# Patient Record
Sex: Male | Born: 1985 | Race: Black or African American | Hispanic: No | Marital: Single | Smoking: Never smoker
Health system: Southern US, Community
[De-identification: ages and names within clinical notes are randomized; demographics above are authoritative.]

## PROBLEM LIST (undated history)

## (undated) HISTORY — PX: MOUTH SURGERY: SHX715

---
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
## 2009-06-18 ENCOUNTER — Emergency Department (HOSPITAL_COMMUNITY): Admission: EM | Admit: 2009-06-18 | Discharge: 2009-06-19 | Payer: Self-pay | Admitting: Emergency Medicine

## 2011-07-05 ENCOUNTER — Emergency Department (HOSPITAL_COMMUNITY)
Admission: EM | Admit: 2011-07-05 | Discharge: 2011-07-05 | Disposition: A | Discharge status: Home / Self Care | Payer: Self-pay | Attending: Emergency Medicine | Admitting: Emergency Medicine

## 2011-07-05 ENCOUNTER — Encounter (HOSPITAL_COMMUNITY): Payer: Self-pay | Admitting: Emergency Medicine

## 2011-07-05 DIAGNOSIS — F17200 Nicotine dependence, unspecified, uncomplicated: Secondary | ICD-10-CM | POA: Insufficient documentation

## 2011-07-05 DIAGNOSIS — H1130 Conjunctival hemorrhage, unspecified eye: Secondary | ICD-10-CM | POA: Insufficient documentation

## 2011-07-05 DIAGNOSIS — H1132 Conjunctival hemorrhage, left eye: Secondary | ICD-10-CM

## 2011-07-05 MED ORDER — ERYTHROMYCIN 5 MG/GM OP OINT: TOPICAL_OINTMENT | OPHTHALMIC | Status: AC

## 2011-07-05 MED ORDER — ERYTHROMYCIN 5 MG/GM OP OINT
TOPICAL_OINTMENT | Freq: Four times a day (QID) | OPHTHALMIC | Status: DC
  Administered 2011-07-05: 1 via OPHTHALMIC
  Filled 2011-07-05: qty 3.5

## 2011-07-05 NOTE — ED Notes (Signed)
Pt presented to the ER with c/o left eye pain, states started after vomiting, on Sunday, and painful since then , pt pr multiple things are tried, ice pack and warm compress to the area. Pt denies any injury to the left eye.

## 2011-07-05 NOTE — ED Notes (Signed)
Patient discharge via ambulatory with a steady gait with friend. Respirations equal and unlabored. Skin warm and dry. No acute distress noted.

## 2011-07-05 NOTE — Discharge Instructions (Signed)
Use eye ointment as prescribed. Try to not rub the eyes as this will irritate it further. Cool compresses 3-4 times a day will help soothe the eye. You can try over-the-counter eye lubricating drops as well. Followup with the eye doctor as indicated on Monday for recheck. Return to the emergency room for worsening condition or new concerning symptoms.

## 2011-07-05 NOTE — ED Provider Notes (Signed)
History     CSN: [id number]  Arrival date & time 07/05/11  0019   First MD Initiated Contact with Patient 07/05/11 0107      Chief Complaint  Patient presents with  . Eye Pain    (Consider location/radiation/quality/duration/timing/severity/associated sxs/prior treatment) HPI 26 year old male presents emergency department with complaint of left eye irritation. Patient reports he had onset of redness to his eye 5 days ago after prolonged vomiting on Sunday night. Patient has had irritation of the eye and has been rubbing it ever since. He reports some crusting to the eye in the mornings when waking. He is tried both warm and cool compresses without improvement. He denies any change in his vision. No prior history of similar symptoms. No trauma to the eye. No foreign object sensation to the eye History reviewed. No pertinent past medical history.  Past Surgical History  Procedure Date  . Mouth surgery     History reviewed. No pertinent family history.  History  Substance Use Topics  . Smoking status: Current Everyday Smoker    Types: Cigarettes  . Smokeless tobacco: Not on file  . Alcohol Use: Yes      Review of Systems  All other systems reviewed and are negative.    Allergies  Review of patient's allergies indicates no known allergies.  Home Medications   Current Outpatient Rx  Name Route Sig Dispense Refill  . ERYTHROMYCIN 5 MG/GM OP OINT  Place a 1/2 inch ribbon of ointment into the lower eyelid of left eye qid x 5 days 1 g 0    BP 120/77  Pulse 82  Temp(Src) 98.5 F (36.9 C) (Oral)  Resp 20  Wt 175 lb 2 oz (79.436 kg)  SpO2 99%  Physical Exam  Constitutional: He appears well-developed and well-nourished. No distress.  HENT:  Head: Normocephalic.  Nose: Nose normal.  Mouth/Throat: Oropharynx is clear and moist.  Eyes: EOM are normal. Pupils are equal, round, and reactive to light. Right eye exhibits no discharge. Left eye exhibits no discharge. No  scleral icterus.       Left conjunctiva noted to have some conjunctival hemorrhage both at the nasal aspect and at the temporal aspect. Some mild chemosis and injection to the conjunctiva diffusely  Skin: He is not diaphoretic.    ED Course  Procedures (including critical care time)  Labs Reviewed - No data to display No results found.   1. Subconjunctival hemorrhage, left       MDM  26 year old male with left sub-conjunctival hemorrhage most likely due to his recent episode of vomiting. Suspect due to patient's rubbing of his eye he has caused secondary irritation. He may have some aspect of mild conjunctivitis. Will start on erythromycin ointment to help soothe the eye. Patient to use cold compresses and avoid rubbing the eye. He is to follow up with ophthalmology on Monday if not improved        Olivia Mackie, MD 07/05/11 [phone]

## 2013-09-20 DIAGNOSIS — S40019A Contusion of unspecified shoulder, initial encounter: Secondary | ICD-10-CM | POA: Insufficient documentation

## 2013-09-20 DIAGNOSIS — Y92410 Unspecified street and highway as the place of occurrence of the external cause: Secondary | ICD-10-CM | POA: Insufficient documentation

## 2013-09-20 DIAGNOSIS — S20219A Contusion of unspecified front wall of thorax, initial encounter: Secondary | ICD-10-CM | POA: Insufficient documentation

## 2013-09-20 DIAGNOSIS — S8000XA Contusion of unspecified knee, initial encounter: Secondary | ICD-10-CM | POA: Insufficient documentation

## 2013-09-20 DIAGNOSIS — Y9389 Activity, other specified: Secondary | ICD-10-CM | POA: Insufficient documentation

## 2013-09-20 DIAGNOSIS — F17200 Nicotine dependence, unspecified, uncomplicated: Secondary | ICD-10-CM | POA: Insufficient documentation

## 2013-09-21 ENCOUNTER — Emergency Department (HOSPITAL_COMMUNITY): Payer: No Typology Code available for payment source | Attending: Emergency Medicine

## 2013-09-21 ENCOUNTER — Emergency Department (HOSPITAL_COMMUNITY): Payer: No Typology Code available for payment source

## 2013-09-21 ENCOUNTER — Emergency Department (HOSPITAL_COMMUNITY)
Admission: EM | Admit: 2013-09-21 | Discharge: 2013-09-21 | Disposition: A | Discharge status: Home / Self Care | Payer: Self-pay | Attending: Emergency Medicine | Admitting: Emergency Medicine

## 2013-09-21 ENCOUNTER — Encounter (HOSPITAL_COMMUNITY): Payer: Self-pay | Admitting: Emergency Medicine

## 2013-09-21 DIAGNOSIS — S40022A Contusion of left upper arm, initial encounter: Secondary | ICD-10-CM

## 2013-09-21 DIAGNOSIS — S20212A Contusion of left front wall of thorax, initial encounter: Secondary | ICD-10-CM

## 2013-09-21 DIAGNOSIS — S40012A Contusion of left shoulder, initial encounter: Secondary | ICD-10-CM

## 2013-09-21 DIAGNOSIS — S8002XA Contusion of left knee, initial encounter: Secondary | ICD-10-CM

## 2013-09-21 MED ORDER — IBUPROFEN 600 MG PO TABS: 600.0000 mg | ORAL_TABLET | Freq: Four times a day (QID) | ORAL | Status: AC | PRN

## 2013-09-21 MED ORDER — TRAMADOL HCL 50 MG PO TABS: 50.0000 mg | ORAL_TABLET | Freq: Four times a day (QID) | ORAL | Status: AC | PRN
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
## 2013-09-21 NOTE — Discharge Instructions (Signed)

## 2015-10-31 ENCOUNTER — Encounter (HOSPITAL_COMMUNITY): Payer: Self-pay | Admitting: Emergency Medicine

## 2015-10-31 ENCOUNTER — Ambulatory Visit (HOSPITAL_COMMUNITY)
Admission: EM | Admit: 2015-10-31 | Discharge: 2015-10-31 | Disposition: A | Discharge status: Home / Self Care | Payer: Self-pay | Attending: Family Medicine | Admitting: Family Medicine

## 2015-10-31 DIAGNOSIS — R112 Nausea with vomiting, unspecified: Secondary | ICD-10-CM

## 2015-10-31 DIAGNOSIS — R197 Diarrhea, unspecified: Secondary | ICD-10-CM

## 2015-10-31 MED ORDER — ONDANSETRON HCL 4 MG PO TABS: 4.0000 mg | ORAL_TABLET | Freq: Four times a day (QID) | ORAL | 0 refills | Status: AC

## 2015-10-31 MED ORDER — ONDANSETRON 4 MG PO TBDP
ORAL_TABLET | ORAL | Status: AC
  Filled 2015-10-31: qty 2

## 2015-10-31 MED ORDER — ONDANSETRON 4 MG PO TBDP
8.0000 mg | ORAL_TABLET | Freq: Once | ORAL | Status: AC
  Administered 2015-10-31: 8 mg via ORAL

## 2015-10-31 NOTE — ED Triage Notes (Signed)
Pt. Stated, I've been nausea with vomiting since last night. I took some Pepto bismol bu tit did not help

## 2015-10-31 NOTE — ED Provider Notes (Signed)
[facility]    CSN: [id number] Arrival date & time: 10/31/15  [id number]  First Provider Contact:  First MD Initiated Contact with Patient 10/31/15 2011        History   Chief Complaint Chief Complaint  Patient presents with  . Nausea  . Emesis    HPI Neil Travis is a 30 y.o. male.   The history is provided by the patient.  Emesis  Severity:  Mild Duration:  1 day Quality:  Stomach contents Progression:  Improving (last episode this am ,also with diarrhea.) Chronicity:  New Relieved by:  Nothing Worsened by:  Nothing Ineffective treatments:  None tried Associated symptoms: abdominal pain and diarrhea   Associated symptoms: no fever   Risk factors: suspect food intake   Risk factors: no sick contacts     History reviewed. No pertinent past medical history.  There are no active problems to display for this patient.   Past Surgical History:  Procedure Laterality Date  . MOUTH SURGERY         Home Medications    Prior to Admission medications   Medication Sig Start Date End Date Taking? Authorizing Provider  ibuprofen (ADVIL,MOTRIN) 600 MG tablet Take 1 tablet (600 mg total) by mouth every 6 (six) hours as needed. 09/21/13   Brandt LoosenJulie Manly, MD  traMADol (ULTRAM) 50 MG tablet Take 1 tablet (50 mg total) by mouth every 6 (six) hours as needed. 09/21/13   Brandt LoosenJulie Manly, MD    Family History No family history on file.  Social History Social History  Substance Use Topics  . Smoking status: Current Every Day Smoker    Types: Cigarettes  . Smokeless tobacco: Current User  . Alcohol use Yes     Allergies   Review of patient's allergies indicates no known allergies.   Review of Systems Review of Systems  Constitutional: Positive for appetite change. Negative for fever.  HENT: Negative.   Respiratory: Negative.   Cardiovascular: Negative.   Gastrointestinal: Positive for abdominal pain, diarrhea, nausea and vomiting. Negative for blood in stool.    Genitourinary: Negative.   All other systems reviewed and are negative.    Physical Exam Triage Vital Signs ED Triage Vitals  Enc Vitals Group     BP 10/31/15 1948 142/93     Pulse Rate 10/31/15 1948 71     Resp 10/31/15 1948 17     Temp 10/31/15 1948 98.6 F (37 C)     Temp Source 10/31/15 1948 Oral     SpO2 10/31/15 1948 99 %     Weight 10/31/15 1949 200 lb (90.7 kg)     Height 10/31/15 1949 5\' 10"  (1.778 m)     Head Circumference --      Peak Flow --      Pain Score 10/31/15 1950 0     Pain Loc --      Pain Edu? --      Excl. in GC? --    No data found.   Updated Vital Signs BP 142/93 (BP Location: Right Arm)   Pulse 71   Temp 98.6 F (37 C) (Oral)   Resp 17   Ht 5\' 10"  (1.778 m)   Wt 200 lb (90.7 kg)   SpO2 99%   BMI 28.70 kg/m   Visual Acuity Right Eye Distance:   Left Eye Distance:   Bilateral Distance:    Right Eye Near:   Left Eye Near:    Bilateral Near:  Physical Exam  Constitutional: He is oriented to person, place, and time. He appears well-developed and well-nourished.  HENT:  Mouth/Throat: Oropharynx is clear and moist.  Neck: Normal range of motion. Neck supple.  Abdominal: Soft. Bowel sounds are normal. He exhibits no distension and no mass. There is no tenderness. There is no rebound and no guarding. No hernia.  Musculoskeletal: Normal range of motion.  Lymphadenopathy:    He has no cervical adenopathy.  Neurological: He is alert and oriented to person, place, and time.  Skin: Skin is warm and dry.  Nursing note and vitals reviewed.    UC Treatments / Results  Labs (all labs ordered are listed, but only abnormal results are displayed) Labs Reviewed - No data to display  EKG  EKG Interpretation None       Radiology No results found.  Procedures Procedures (including critical care time)  Medications Ordered in UC Medications - No data to display   Initial Impression / Assessment and Plan / UC Course  I have  reviewed the triage vital signs and the nursing notes.  Pertinent labs & imaging results that were available during my care of the patient were reviewed by me and considered in my medical decision making (see chart for details).  Clinical Course      Final Clinical Impressions(s) / UC Diagnoses   Final diagnoses:  None    New Prescriptions New Prescriptions   No medications on file     Linna Hoff, MD 10/31/15 2032

## 2015-10-31 NOTE — Discharge Instructions (Signed)
Clear liquid , bland diet tonight as tolerated, advance on wed as improved, use medicine as needed,imodium for diarrhea. return or see your doctor if any problems.
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
## 2018-02-26 ENCOUNTER — Encounter (HOSPITAL_COMMUNITY): Payer: Self-pay

## 2018-02-26 ENCOUNTER — Emergency Department (HOSPITAL_COMMUNITY)
Admission: EM | Admit: 2018-02-26 | Discharge: 2018-02-26 | Disposition: A | Discharge status: Home / Self Care | Payer: Self-pay | Attending: Emergency Medicine | Admitting: Emergency Medicine

## 2018-02-26 DIAGNOSIS — J020 Streptococcal pharyngitis: Secondary | ICD-10-CM | POA: Insufficient documentation

## 2018-02-26 DIAGNOSIS — F17210 Nicotine dependence, cigarettes, uncomplicated: Secondary | ICD-10-CM | POA: Insufficient documentation

## 2018-02-26 LAB — GROUP A STREP BY PCR: Group A Strep by PCR: DETECTED — AB

## 2018-02-26 MED ORDER — PENICILLIN V POTASSIUM 500 MG PO TABS: 500.0000 mg | ORAL_TABLET | Freq: Two times a day (BID) | ORAL | 0 refills | Status: AC

## 2018-02-26 MED ORDER — DEXAMETHASONE SODIUM PHOSPHATE 10 MG/ML IJ SOLN
10.0000 mg | Freq: Once | INTRAMUSCULAR | Status: AC
  Administered 2018-02-26: 10 mg via INTRAMUSCULAR
  Filled 2018-02-26: qty 1

## 2018-02-26 NOTE — Discharge Instructions (Addendum)
You were evaluated today for sore throat. You strep test was positive. I have prescribed you Penicillin. Please take as prescribed. You may take Tylenol and Ibuprofen for your symptoms.  Return to the ED with any new or worsening symptoms.

## 2018-02-26 NOTE — ED Provider Notes (Signed)
[facility] EMERGENCY DEPARTMENT Provider Note   CSN: [id number] Arrival date & time: 02/26/18  1429  History   Chief Complaint Sore throat   HPI Neil Travis is a 32 y.o. male with past medical history significant for seasonal allergies who presents for evaluation of sore throat.  Patient states he started with a scratchy throat onset 3 weeks ago, however worsened over the last 2 days.  Patient states he was previously on Zyrtec for seasonal allergies which he states he has been out for the last 2 weeks.  Not take anything for symptoms PTA.  Patient states he does have itchy watery eyes as well as clear rhinorrhea.  Has been able to tolerate p.o. intake without difficulty.  Denies fever, chills, neck pain, neck stiffness, cough, chest pain, shortness of breath, abdominal pain, diarrhea.  Pain rates an 8/10.  Pain does not radiate.  Denies aggravating or alleviating factors. Patient states he has had and increase in sneezing as well.  History obtained from patient.  No interpreter was used.  HPI  History reviewed. No pertinent past medical history.  There are no active problems to display for this patient.   Past Surgical History:  Procedure Laterality Date  . MOUTH SURGERY         Home Medications    Prior to Admission medications   Medication Sig Start Date End Date Taking? Authorizing Provider  ibuprofen (ADVIL,MOTRIN) 600 MG tablet Take 1 tablet (600 mg total) by mouth every 6 (six) hours as needed. 09/21/13   Brandt Loosen, MD  ondansetron (ZOFRAN) 4 MG tablet Take 1 tablet (4 mg total) by mouth every 6 (six) hours. Prn n/v. 10/31/15   Linna Hoff, MD  penicillin v potassium (VEETID) 500 MG tablet Take 1 tablet (500 mg total) by mouth 2 (two) times daily with a meal for 10 days. 02/26/18 03/08/18  Henderly, Britni A, PA-C  traMADol (ULTRAM) 50 MG tablet Take 1 tablet (50 mg total) by mouth every 6 (six) hours as needed. 09/21/13   Brandt Loosen, MD     Family History History reviewed. No pertinent family history.  Social History Social History   Tobacco Use  . Smoking status: Current Every Day Smoker    Types: Cigarettes  . Smokeless tobacco: Current User  Substance Use Topics  . Alcohol use: Yes  . Drug use: No     Allergies   Patient has no known allergies.   Review of Systems Review of Systems  Constitutional: Negative.   HENT: Positive for postnasal drip, rhinorrhea, sneezing and sore throat. Negative for congestion, drooling, ear discharge, ear pain, facial swelling, hearing loss, mouth sores, nosebleeds, sinus pressure, sinus pain, tinnitus, trouble swallowing and voice change.   Eyes: Positive for itching. Negative for photophobia, pain, redness and visual disturbance.       Clear watery bilateral eye discharge  Respiratory: Negative.   Cardiovascular: Negative.   Gastrointestinal: Negative.   Genitourinary: Negative.   Musculoskeletal: Negative.   Skin: Negative.   Neurological: Negative.   All other systems reviewed and are negative.    Physical Exam Updated Vital Signs BP (!) 149/99 (BP Location: Right Arm)   Pulse 95   Temp 98.3 F (36.8 C) (Oral)   Resp 18   Ht 5\' 10"  (1.778 m)   Wt 95.3 kg   SpO2 100%   BMI 30.13 kg/m   Physical Exam Vitals signs and nursing note reviewed.  Constitutional:      General:  He is not in acute distress.    Appearance: He is well-developed. He is not ill-appearing, toxic-appearing or diaphoretic.  HENT:     Head: Normocephalic and atraumatic.     Right Ear: Tympanic membrane, ear canal and external ear normal. No drainage, swelling or tenderness. There is no impacted cerumen. Tympanic membrane is not scarred, perforated, erythematous or retracted.     Left Ear: Tympanic membrane, ear canal and external ear normal. No drainage, swelling or tenderness. There is no impacted cerumen. Tympanic membrane is not scarred, perforated, erythematous or retracted.      Nose: Rhinorrhea present.     Right Turbinates: Enlarged and swollen. Not pale.     Left Turbinates: Enlarged and swollen. Not pale.     Right Sinus: No maxillary sinus tenderness or frontal sinus tenderness.     Left Sinus: No maxillary sinus tenderness or frontal sinus tenderness.     Mouth/Throat:     Lips: Pink.     Mouth: Mucous membranes are moist. No injury, oral lesions or angioedema.     Tongue: No lesions.     Pharynx: Oropharynx is clear. Uvula midline. No oropharyngeal exudate, posterior oropharyngeal erythema or uvula swelling.     Tonsils: No tonsillar exudate or tonsillar abscesses. Swelling: 1+ on the right. 1+ on the left.     Comments: Posterior oropharynx with erythema without edema or exudate.  Uvula midline without edema.  Tonsils 1+ bilaterally, no gross exudate noted.  Phonation normal.  No evidence of PTA, RPA, deep space infection. Eyes:     Pupils: Pupils are equal, round, and reactive to light.  Neck:     Musculoskeletal: Full passive range of motion without pain, normal range of motion and neck supple. Normal range of motion. No edema, erythema, neck rigidity, crepitus, injury, pain with movement, torticollis, spinous process tenderness or muscular tenderness.     Thyroid: No thyroid mass, thyromegaly or thyroid tenderness.     Vascular: No carotid bruit or JVD.     Trachea: Trachea and phonation normal. No tracheal tenderness.     Comments: Nontender to palpation.  Full range of motion without difficulty.  No stiffness or rigidity.  No cervical lymphadenopathy. Cardiovascular:     Rate and Rhythm: Normal rate and regular rhythm.     Pulses: Normal pulses.     Heart sounds: Normal heart sounds. No friction rub. No gallop.   Pulmonary:     Effort: Pulmonary effort is normal. No respiratory distress.  Abdominal:     General: There is no distension.     Palpations: Abdomen is soft.  Musculoskeletal: Normal range of motion.     Right lower leg: No edema.      Left lower leg: No edema.  Lymphadenopathy:     Cervical: No cervical adenopathy.  Skin:    General: Skin is warm and dry.  Neurological:     Mental Status: He is alert.      ED Treatments / Results  Labs (all labs ordered are listed, but only abnormal results are displayed) Labs Reviewed  GROUP A STREP BY PCR - Abnormal; Notable for the following components:      Result Value   Group A Strep by PCR DETECTED (*)    All other components within normal limits    EKG None  Radiology No results found.  Procedures Procedures (including critical care time)  Medications Ordered in ED Medications  dexamethasone (DECADRON) injection 10 mg (10 mg Intramuscular Given 02/26/18 1554)  Initial Impression / Assessment and Plan / ED Course  I have reviewed the triage vital signs and the nursing notes.  Pertinent labs & imaging results that were available during my care of the patient were reviewed by me and considered in my medical decision making (see chart for details).  32 year old male who appears otherwise well presents for evaluation of sore throat.  Pain onset 3 weeks ago, however worsened over the last 2 days.  Able to tolerate p.o. intake without difficulty.  Afebrile, nonseptic, non-ill-appearing.  Patient with mild posterior oropharyngeal erythema without edema.  Tonsils 1+ without any gross exudate.  Uvula midline without edema.  No evidence of PTA, RPA or deep space infection.  No drooling or dysphasia.  Patient able to tolerate p.o. intake in department without difficulty.  No neck stiffness or neck rigidity.  Received steroids in department.  Patient with moist mucous membranes.  No suspicion for dehydration, however discussed with patient importance of proper hydration.  No trismus.  Patient is hemodynamically stable and appropriate for DC home at this time.  Patient did have mild elevation in his blood pressure.  He is asymptomatic without any chest pain, shortness of  breath, headache or vision changes.  Discussed follow-up with PCP for reevaluation of his blood pressure.  Discussed strict return precautions.  Patient voiced understanding and is agreeable for follow-up.    Final Clinical Impressions(s) / ED Diagnoses   Final diagnoses:  Strep pharyngitis    ED Discharge Orders         Ordered    penicillin v potassium (VEETID) 500 MG tablet  2 times daily with meals     02/26/18 1659           Henderly, Britni A, PA-C 02/26/18 1707    Melene PlanFloyd, Dan, DO 02/26/18 1836

## 2018-02-26 NOTE — ED Triage Notes (Signed)
Pt. Reports symtoms started 3 weeks ago. Pt. Reports feeling that it was an allergy from work that has turned into a sore throat. Pt. A/o X4

## 2019-10-29 ENCOUNTER — Other Ambulatory Visit: Payer: Self-pay

## 2019-10-29 ENCOUNTER — Emergency Department (HOSPITAL_COMMUNITY): Admission: EM | Admit: 2019-10-29 | Discharge: 2019-10-30 | Discharge status: Against Medical Advice | Payer: Self-pay

## 2019-10-29 NOTE — ED Notes (Signed)
No answer in WR x1

## 2019-10-30 ENCOUNTER — Encounter (HOSPITAL_COMMUNITY): Payer: Self-pay | Admitting: *Deleted

## 2019-10-30 ENCOUNTER — Other Ambulatory Visit: Payer: Self-pay

## 2019-10-30 ENCOUNTER — Ambulatory Visit (HOSPITAL_COMMUNITY)
Admission: EM | Admit: 2019-10-30 | Discharge: 2019-10-30 | Disposition: A | Discharge status: Home / Self Care | Payer: HRSA Program | Attending: Urgent Care | Admitting: Urgent Care

## 2019-10-30 DIAGNOSIS — Z880 Allergy status to penicillin: Secondary | ICD-10-CM | POA: Insufficient documentation

## 2019-10-30 DIAGNOSIS — Z79899 Other long term (current) drug therapy: Secondary | ICD-10-CM | POA: Insufficient documentation

## 2019-10-30 DIAGNOSIS — Z791 Long term (current) use of non-steroidal anti-inflammatories (NSAID): Secondary | ICD-10-CM | POA: Insufficient documentation

## 2019-10-30 DIAGNOSIS — U071 COVID-19: Secondary | ICD-10-CM | POA: Insufficient documentation

## 2019-10-30 DIAGNOSIS — J069 Acute upper respiratory infection, unspecified: Secondary | ICD-10-CM

## 2019-10-30 MED ORDER — CEPACOL SORE THROAT 5.4 MG MT LOZG
1.0000 | LOZENGE | OROMUCOSAL | 0 refills | Status: AC | PRN
Start: 2019-10-30 — End: ?

## 2019-10-30 MED ORDER — BENZONATATE 100 MG PO CAPS: 100.0000 mg | ORAL_CAPSULE | Freq: Three times a day (TID) | ORAL | 0 refills | Status: AC

## 2019-10-30 MED ORDER — FLUTICASONE PROPIONATE 50 MCG/ACT NA SUSP: 1.0000 | Freq: Every day | NASAL | 2 refills | Status: AC

## 2019-10-30 MED ORDER — ACETAMINOPHEN 325 MG PO TABS: 650.0000 mg | ORAL_TABLET | Freq: Four times a day (QID) | ORAL | 0 refills | Status: AC | PRN

## 2019-10-30 MED ORDER — ALBUTEROL SULFATE HFA 108 (90 BASE) MCG/ACT IN AERS: 1.0000 | INHALATION_SPRAY | Freq: Four times a day (QID) | RESPIRATORY_TRACT | 0 refills | Status: AC | PRN

## 2019-10-30 NOTE — Discharge Instructions (Signed)
Take medications as prescribed -Only use the inhaler as needed for chest tightness and cough up to every 6 hours  If you have developing worsening shortness of breath, high fevers or other severe symptoms go to the emergency department  If your Covid-19 test is positive, you will receive a phone call from [facility] regarding your results. Negative test results are not called. Both positive and negative results area always visible on MyChart. If you do not have a MyChart account, sign up instructions are in your discharge papers.   Persons who are directed to care for themselves at home may discontinue isolation under the following conditions:   At least 10 days have passed since symptom onset and  At least 24 hours have passed without running a fever (this means without the use of fever-reducing medications) and  Other symptoms have improved.  Persons infected with COVID-19 who never develop symptoms may discontinue isolation and other precautions 10 days after the date of their first positive COVID-19 test. .

## 2019-10-30 NOTE — ED Triage Notes (Signed)
C/O starting with sinus pressure 6 days ago; yesterday lost sense of taste and smell.  C/O fevers and chills.

## 2019-10-30 NOTE — ED Provider Notes (Signed)
[facility]    CSN: [id number] Arrival date & time: 10/30/19  1019      History   Chief Complaint Chief Complaint  Patient presents with   Loss of Taste/Smell   Chills    HPI Neil Travis is a 34 y.o. male.   Patient presents for nasal congestion, chills and loss of taste and smell.  Reports symptoms started around 6 days ago sinus pressure and congestion.  He has had body ache and chills over this course.  Noticed loss of taste and smell yesterday.  He reports a slight cough worse at night.  Nonproductive.  He also had a scratchy throat.  He has not had a fever at home.  There is been no nausea, vomiting or diarrhea.  Does endorse a low level headache.  He does endorse some chest tightness with cough.  Otherwise denies shortness of breath.  No chest pain.     History reviewed. No pertinent past medical history.  There are no problems to display for this patient.   Past Surgical History:  Procedure Laterality Date   MOUTH SURGERY         Home Medications    Prior to Admission medications   Medication Sig Start Date End Date Taking? Authorizing Provider  acetaminophen (TYLENOL) 325 MG tablet Take 2 tablets (650 mg total) by mouth every 6 (six) hours as needed. 10/30/19   Aliyah Abeyta, Veryl Speak, PA-C  albuterol (VENTOLIN HFA) 108 (90 Base) MCG/ACT inhaler Inhale 1-2 puffs into the lungs every 6 (six) hours as needed for wheezing or shortness of breath. 10/30/19   Evelyn Aguinaldo, Veryl Speak, PA-C  benzonatate (TESSALON) 100 MG capsule Take 1-2 capsules (100-200 mg total) by mouth 3 (three) times daily for 10 days. 10/30/19 11/09/19  Luisfernando Brightwell, Veryl Speak, PA-C  fluticasone (FLONASE) 50 MCG/ACT nasal spray Place 1 spray into both nostrils daily. 10/30/19   Leaira Fullam, Veryl Speak, PA-C  ibuprofen (ADVIL,MOTRIN) 600 MG tablet Take 1 tablet (600 mg total) by mouth every 6 (six) hours as needed. 09/21/13   Brandt Loosen, MD  Menthol (CEPACOL SORE THROAT) 5.4 MG LOZG Use as directed 1 lozenge (5.4 mg total)  in the mouth or throat every 2 (two) hours as needed. 10/30/19   Anet Logsdon, Veryl Speak, PA-C  ondansetron (ZOFRAN) 4 MG tablet Take 1 tablet (4 mg total) by mouth every 6 (six) hours. Prn n/v. 10/31/15   Linna Hoff, MD  traMADol (ULTRAM) 50 MG tablet Take 1 tablet (50 mg total) by mouth every 6 (six) hours as needed. 09/21/13   Brandt Loosen, MD    Family History Family History  Problem Relation Age of Onset   Healthy Mother    Healthy Father     Social History Social History   Tobacco Use   Smoking status: Never Smoker   Smokeless tobacco: Never Used  Building services engineer Use: Never used  Substance Use Topics   Alcohol use: Yes    Comment: rarely   Drug use: Yes    Types: Marijuana     Allergies   Apple and Penicillins   Review of Systems Review of Systems   Physical Exam Triage Vital Signs ED Triage Vitals  Enc Vitals Group     BP 10/30/19 1204 121/81     Pulse Rate 10/30/19 1202 84     Resp 10/30/19 1202 18     Temp 10/30/19 1202 100 F (37.8 C)     Temp Source 10/30/19 1202 Oral  SpO2 10/30/19 1202 98 %     Weight --      Height --      Head Circumference --      Peak Flow --      Pain Score 10/30/19 1205 1     Pain Loc --      Pain Edu? --      Excl. in GC? --    No data found.  Updated Vital Signs BP 121/81    Pulse 84    Temp 100 F (37.8 C) (Oral)    Resp 18    SpO2 98%   Visual Acuity Right Eye Distance:   Left Eye Distance:   Bilateral Distance:    Right Eye Near:   Left Eye Near:    Bilateral Near:     Physical Exam Vitals and nursing note reviewed.  Constitutional:      General: He is not in acute distress.    Appearance: He is well-developed. He is not ill-appearing or toxic-appearing.  HENT:     Head: Normocephalic and atraumatic.     Right Ear: Tympanic membrane normal.     Left Ear: Tympanic membrane normal.     Nose: Congestion and rhinorrhea present.     Mouth/Throat:     Comments: Postnasal drip Eyes:      Extraocular Movements: Extraocular movements intact.     Conjunctiva/sclera: Conjunctivae normal.     Pupils: Pupils are equal, round, and reactive to light.  Cardiovascular:     Rate and Rhythm: Normal rate and regular rhythm.     Heart sounds: No murmur heard.   Pulmonary:     Effort: Pulmonary effort is normal. No respiratory distress.     Breath sounds: Normal breath sounds. No wheezing, rhonchi or rales.  Abdominal:     Palpations: Abdomen is soft.     Tenderness: There is no abdominal tenderness.  Musculoskeletal:     Cervical back: Neck supple.  Skin:    General: Skin is warm and dry.  Neurological:     Mental Status: He is alert.      UC Treatments / Results  Labs (all labs ordered are listed, but only abnormal results are displayed) Labs Reviewed  SARS CORONAVIRUS 2 (TAT 6-24 HRS)    EKG   Radiology No results found.  Procedures Procedures (including critical care time)  Medications Ordered in UC Medications - No data to display  Initial Impression / Assessment and Plan / UC Course  I have reviewed the triage vital signs and the nursing notes.  Pertinent labs & imaging results that were available during my care of the patient were reviewed by me and considered in my medical decision making (see chart for details).    #Viral URI with cough Patient is 34 year old presenting with viral upper respiratory infection.  Low-grade temperature here.  Suspicious for Covid.  Covid sent.  Will treat symptomatically to include as needed albuterol inhaler for chest tightness.  Discussed return, follow-up and emergency department precautions.  Patient verbalized understand plan of care. Final Clinical Impressions(s) / UC Diagnoses   Final diagnoses:  Viral URI with cough     Discharge Instructions     Take medications as prescribed -Only use the inhaler as needed for chest tightness and cough up to every 6 hours  If you have developing worsening shortness of  breath, high fevers or other severe symptoms go to the emergency department  If your Covid-19 test is positive, you will receive  a phone call from [facility] regarding your results. Negative test results are not called. Both positive and negative results area always visible on MyChart. If you do not have a MyChart account, sign up instructions are in your discharge papers.   Persons who are directed to care for themselves at home may discontinue isolation under the following conditions:   At least 10 days have passed since symptom onset and  At least 24 hours have passed without running a fever (this means without the use of fever-reducing medications) and  Other symptoms have improved.  Persons infected with COVID-19 who never develop symptoms may discontinue isolation and other precautions 10 days after the date of their first positive COVID-19 test. .      ED Prescriptions    Medication Sig Dispense Auth. Provider   benzonatate (TESSALON) 100 MG capsule Take 1-2 capsules (100-200 mg total) by mouth 3 (three) times daily for 10 days. 60 capsule Daquana Paddock, Veryl Speak, PA-C   fluticasone (FLONASE) 50 MCG/ACT nasal spray Place 1 spray into both nostrils daily. 15.8 mL Marysa Wessner, Veryl Speak, PA-C   Menthol (CEPACOL SORE THROAT) 5.4 MG LOZG Use as directed 1 lozenge (5.4 mg total) in the mouth or throat every 2 (two) hours as needed. 30 lozenge Kaylena Pacifico, Veryl Speak, PA-C   albuterol (VENTOLIN HFA) 108 (90 Base) MCG/ACT inhaler Inhale 1-2 puffs into the lungs every 6 (six) hours as needed for wheezing or shortness of breath. 6.7 g Lorrinda Ramstad, Veryl Speak, PA-C   acetaminophen (TYLENOL) 325 MG tablet Take 2 tablets (650 mg total) by mouth every 6 (six) hours as needed. 30 tablet Krystin Keeven, Veryl Speak, PA-C     PDMP not reviewed this encounter.   Hermelinda Medicus, PA-C 10/30/19 2201

## 2019-10-30 NOTE — ED Notes (Signed)
Registration states pt has left

## 2019-10-31 LAB — SARS CORONAVIRUS 2 (TAT 6-24 HRS): SARS Coronavirus 2: POSITIVE — AB

## 2022-11-27 ENCOUNTER — Encounter (HOSPITAL_COMMUNITY): Payer: Self-pay | Admitting: Emergency Medicine

## 2022-11-27 ENCOUNTER — Other Ambulatory Visit: Payer: Self-pay

## 2022-11-27 ENCOUNTER — Emergency Department (HOSPITAL_COMMUNITY)
Admission: EM | Admit: 2022-11-27 | Discharge: 2022-11-27 | Disposition: A | Discharge status: Home / Self Care | Payer: Self-pay | Attending: Emergency Medicine | Admitting: Emergency Medicine

## 2022-11-27 DIAGNOSIS — H6123 Impacted cerumen, bilateral: Secondary | ICD-10-CM | POA: Insufficient documentation

## 2022-11-27 MED ORDER — DOCUSATE SODIUM 50 MG/5ML PO LIQD
10.0000 mg | Freq: Once | ORAL | Status: AC
  Administered 2022-11-27: 10 mg via OTIC
  Filled 2022-11-27: qty 10

## 2022-11-27 MED ORDER — CARBAMIDE PEROXIDE 6.5 % OT SOLN: 5.0000 [drp] | Freq: Two times a day (BID) | OTIC | 0 refills | Status: AC

## 2022-11-27 NOTE — ED Notes (Signed)
PA is aware of PT's ear situation

## 2022-11-27 NOTE — ED Provider Notes (Signed)
[place] [facility] Provider Note   CSN: [id number] Arrival date & time: 11/27/22  1301     History  Chief Complaint  Patient presents with   Ear Fullness    Neil Travis is a 37 y.o. male.  Patient presents to the emergency department today for evaluation of right ear pain and decreased hearing.  This started about 2 days ago.  Pain worsened yesterday.  No drainage from the ear.  No head injuries.  He has had a little bit of a runny nose but no sore throat, fever, cough.  He states that his ears, on either side, typically get "stopped up" 2-3 times per year.  He has had irrigations performed in the past.  No treatments prior to arrival.       Home Medications Prior to Admission medications   Medication Sig Start Date End Date Taking? Authorizing Provider  acetaminophen (TYLENOL) 325 MG tablet Take 2 tablets (650 mg total) by mouth every 6 (six) hours as needed. 10/30/19   Darr, Gerilyn Pilgrim, PA-C  albuterol (VENTOLIN HFA) 108 (90 Base) MCG/ACT inhaler Inhale 1-2 puffs into the lungs every 6 (six) hours as needed for wheezing or shortness of breath. 10/30/19   Darr, Gerilyn Pilgrim, PA-C  fluticasone (FLONASE) 50 MCG/ACT nasal spray Place 1 spray into both nostrils daily. 10/30/19   Darr, Gerilyn Pilgrim, PA-C  ibuprofen (ADVIL,MOTRIN) 600 MG tablet Take 1 tablet (600 mg total) by mouth every 6 (six) hours as needed. 09/21/13   Brandt Loosen, MD  Menthol (CEPACOL SORE THROAT) 5.4 MG LOZG Use as directed 1 lozenge (5.4 mg total) in the mouth or throat every 2 (two) hours as needed. 10/30/19   Darr, Gerilyn Pilgrim, PA-C  ondansetron (ZOFRAN) 4 MG tablet Take 1 tablet (4 mg total) by mouth every 6 (six) hours. Prn n/v. 10/31/15   Linna Hoff, MD  traMADol (ULTRAM) 50 MG tablet Take 1 tablet (50 mg total) by mouth every 6 (six) hours as needed. 09/21/13   Brandt Loosen, MD      Allergies    Apple juice and Penicillins    Review of Systems   Review of Systems  Physical Exam Updated  Vital Signs BP (!) 140/89 (BP Location: Left Arm)   Pulse 82   Temp 98.7 F (37.1 C) (Oral)   Resp 17   Ht 5\' 10"  (1.778 m)   Wt 95 kg   SpO2 98%   BMI 30.05 kg/m   Physical Exam Vitals and nursing note reviewed.  Constitutional:      Appearance: He is well-developed.  HENT:     Head: Normocephalic and atraumatic.     Jaw: No trismus.     Right Ear: There is impacted cerumen.     Left Ear: There is impacted cerumen.     Ears:     Comments: Bilateral cerumen impaction    Nose: Nose normal. No mucosal edema or rhinorrhea.     Mouth/Throat:     Mouth: Mucous membranes are not dry.     Pharynx: Uvula midline. No oropharyngeal exudate, posterior oropharyngeal erythema or uvula swelling.     Tonsils: No tonsillar abscesses.  Eyes:     General:        Right eye: No discharge.        Left eye: No discharge.     Conjunctiva/sclera: Conjunctivae normal.  Cardiovascular:     Rate and Rhythm: Normal rate.  Pulmonary:     Effort: Pulmonary effort  is normal.  Abdominal:     Palpations: Abdomen is soft.  Musculoskeletal:     Cervical back: Normal range of motion and neck supple.  Skin:    General: Skin is warm and dry.  Neurological:     Mental Status: He is alert.     ED Results / Procedures / Treatments   Labs (all labs ordered are listed, but only abnormal results are displayed) Labs Reviewed - No data to display  EKG None  Radiology No results found.  Procedures Procedures    Medications Ordered in ED Medications  docusate (COLACE) 50 MG/5ML liquid 10 mg (10 mg Right EAR Given 11/27/22 1527)    ED Course/ Medical Decision Making/ A&P    Patient seen and examined. History obtained directly from patient.   Labs/EKG: None ordered  Imaging: None ordered  Medications/Fluids: None ordered Most recent vital signs reviewed and are as follows: BP (!) 140/89 (BP Location: Left Arm)   Pulse 82   Temp 98.7 F (37.1 C) (Oral)   Resp 17   Ht 5\' 10"  (1.778 m)    Wt 95 kg   SpO2 98%   BMI 30.05 kg/m   Initial impression: Cerumen impaction, will attempt irrigation here and have patient continue at home.    Reassessment performed. Patient appears stable.  Ear was irrigated by RN.  Unable to get out the impacted cerumen.    Most current vital signs reviewed and are as follows: BP (!) 140/89 (BP Location: Left Arm)   Pulse 82   Temp 98.7 F (37.1 C) (Oral)   Resp 17   Ht 5\' 10"  (1.778 m)   Wt 95 kg   SpO2 98%   BMI 30.05 kg/m   Plan: Discharge to home.  Will start on Debrox.  Prescriptions written for: Debrox  ED return instructions discussed: New or worsening symptoms  Follow-up instructions discussed: Patient encouraged to follow-up with ENT if not improving in 1 week.                                  Medical Decision Making Risk OTC drugs.   R ear pain, hearing decreased. Cerumen impaction. Low concern for infection/trauma.           Final Clinical Impression(s) / ED Diagnoses Final diagnoses:  Bilateral impacted cerumen    Rx / DC Orders ED Discharge Orders          Ordered    carbamide peroxide (DEBROX) 6.5 % OTIC solution  2 times daily        11/27/22 1521              Renne Crigler, PA-C 11/27/22 1712    Benjiman Core, MD 11/28/22 2318

## 2022-11-27 NOTE — ED Notes (Signed)
Placed colace in pt's ears to soften wax, let sit for 10 min the irrigated a couple of times, nothing came out

## 2022-11-27 NOTE — Discharge Instructions (Signed)
Use the Debrox as directed.  If things are not getting better in 1 week, please follow-up with ear nose and throat for further treatment.

## 2022-11-27 NOTE — ED Triage Notes (Signed)
Pt here from home with c/o right ear fullness that started 2 days ago

## 2022-11-27 NOTE — ED Provider Triage Note (Signed)
Emergency Medicine Provider Triage Evaluation Note  Neil Travis , a 37 y.o. male  was evaluated in triage.  Pt complains of pain in right ear, loss of hearing. Hx of over production of wax. Has been trying peroxide and qtips without relief. Endorses decreased hearing on that side .  Review of Systems  Positive: Ear pain, loss of hearing Negative:   Physical Exam  BP (!) 140/89 (BP Location: Left Arm)   Pulse 82   Temp 98.7 F (37.1 C) (Oral)   Resp 17   SpO2 98%  Gen:   Awake, no distress   Resp:  Normal effort  MSK:   Moves extremities without difficulty  Other:  Impacted cerumen in right ear, TM not visualized  Medical Decision Making  Medically screening exam initiated at 1:20 PM.  Appropriate orders placed.  Neil Travis was informed that the remainder of the evaluation will be completed by another provider, this initial triage assessment does not replace that evaluation, and the importance of remaining in the ED until their evaluation is complete.  Workup initiated in triage Colace ordered in triage. Patient will need debridement.   Olene Floss, PA-C 11/27/22 1322
